# Patient Record
Sex: Female | Born: 1975 | Race: White | Hispanic: No | Marital: Married | Smoking: Never smoker
Health system: Southern US, Community
[De-identification: ages and names within clinical notes are randomized; demographics above are authoritative.]

## PROBLEM LIST (undated history)

## (undated) DIAGNOSIS — Z8719 Personal history of other diseases of the digestive system: Secondary | ICD-10-CM

## (undated) DIAGNOSIS — E282 Polycystic ovarian syndrome: Secondary | ICD-10-CM

## (undated) DIAGNOSIS — I10 Essential (primary) hypertension: Secondary | ICD-10-CM

## (undated) DIAGNOSIS — G43009 Migraine without aura, not intractable, without status migrainosus: Secondary | ICD-10-CM

## (undated) DIAGNOSIS — E559 Vitamin D deficiency, unspecified: Secondary | ICD-10-CM

## (undated) DIAGNOSIS — F411 Generalized anxiety disorder: Secondary | ICD-10-CM

## (undated) DIAGNOSIS — N946 Dysmenorrhea, unspecified: Secondary | ICD-10-CM

## (undated) HISTORY — DX: Polycystic ovarian syndrome: E28.2

## (undated) HISTORY — DX: Personal history of other diseases of the digestive system: Z87.19

## (undated) HISTORY — DX: Dysmenorrhea, unspecified: N94.6

## (undated) HISTORY — DX: Essential (primary) hypertension: I10

## (undated) HISTORY — DX: Migraine without aura, not intractable, without status migrainosus: G43.009

## (undated) HISTORY — DX: Vitamin D deficiency, unspecified: E55.9

## (undated) HISTORY — DX: Generalized anxiety disorder: F41.1

---

## 1997-11-26 HISTORY — PX: CHOLECYSTECTOMY: SHX55

## 2005-11-26 HISTORY — PX: TONSILLECTOMY: SUR1361

## 2010-10-17 ENCOUNTER — Other Ambulatory Visit: Payer: Self-pay

## 2010-11-01 ENCOUNTER — Other Ambulatory Visit: Payer: Self-pay

## 2011-09-19 ENCOUNTER — Other Ambulatory Visit: Payer: Self-pay | Admitting: Internal Medicine

## 2011-10-25 ENCOUNTER — Other Ambulatory Visit: Payer: Self-pay | Admitting: Family Medicine

## 2017-02-19 DIAGNOSIS — J302 Other seasonal allergic rhinitis: Secondary | ICD-10-CM | POA: Insufficient documentation

## 2017-02-19 DIAGNOSIS — F411 Generalized anxiety disorder: Secondary | ICD-10-CM

## 2017-02-19 DIAGNOSIS — I10 Essential (primary) hypertension: Secondary | ICD-10-CM

## 2017-02-19 HISTORY — DX: Essential (primary) hypertension: I10

## 2017-02-19 HISTORY — DX: Generalized anxiety disorder: F41.1

## 2017-07-04 ENCOUNTER — Other Ambulatory Visit: Payer: Self-pay | Admitting: Obstetrics and Gynecology

## 2017-07-04 DIAGNOSIS — Z1231 Encounter for screening mammogram for malignant neoplasm of breast: Secondary | ICD-10-CM

## 2017-08-01 ENCOUNTER — Encounter: Payer: Self-pay | Admitting: Radiology

## 2017-08-01 ENCOUNTER — Ambulatory Visit
Admission: RE | Admit: 2017-08-01 | Discharge: 2017-08-01 | Disposition: A | Discharge status: Home / Self Care | Payer: 59 | Source: Ambulatory Visit | Attending: Obstetrics and Gynecology | Admitting: Obstetrics and Gynecology

## 2017-08-01 DIAGNOSIS — N6489 Other specified disorders of breast: Secondary | ICD-10-CM | POA: Insufficient documentation

## 2017-08-01 DIAGNOSIS — R928 Other abnormal and inconclusive findings on diagnostic imaging of breast: Secondary | ICD-10-CM | POA: Diagnosis not present

## 2017-08-01 DIAGNOSIS — Z1231 Encounter for screening mammogram for malignant neoplasm of breast: Secondary | ICD-10-CM | POA: Insufficient documentation

## 2017-08-08 ENCOUNTER — Inpatient Hospital Stay
Admission: RE | Admit: 2017-08-08 | Discharge: 2017-08-08 | Disposition: A | Discharge status: Home / Self Care | Payer: Self-pay | Source: Ambulatory Visit | Attending: *Deleted | Admitting: *Deleted

## 2017-08-08 ENCOUNTER — Other Ambulatory Visit: Payer: Self-pay | Admitting: *Deleted

## 2017-08-08 DIAGNOSIS — Z9289 Personal history of other medical treatment: Secondary | ICD-10-CM

## 2017-08-09 ENCOUNTER — Other Ambulatory Visit: Payer: Self-pay | Admitting: Obstetrics and Gynecology

## 2017-08-09 DIAGNOSIS — R928 Other abnormal and inconclusive findings on diagnostic imaging of breast: Secondary | ICD-10-CM

## 2017-08-30 ENCOUNTER — Ambulatory Visit: Payer: 59

## 2017-08-30 ENCOUNTER — Other Ambulatory Visit: Payer: 59

## 2017-09-02 ENCOUNTER — Ambulatory Visit
Admission: RE | Admit: 2017-09-02 | Discharge: 2017-09-02 | Disposition: A | Discharge status: Home / Self Care | Payer: 59 | Source: Ambulatory Visit | Attending: Obstetrics and Gynecology | Admitting: Obstetrics and Gynecology

## 2017-09-02 DIAGNOSIS — R928 Other abnormal and inconclusive findings on diagnostic imaging of breast: Secondary | ICD-10-CM | POA: Diagnosis not present

## 2018-02-05 DIAGNOSIS — Z8719 Personal history of other diseases of the digestive system: Secondary | ICD-10-CM | POA: Insufficient documentation

## 2018-02-05 DIAGNOSIS — E282 Polycystic ovarian syndrome: Secondary | ICD-10-CM

## 2018-02-05 DIAGNOSIS — G43009 Migraine without aura, not intractable, without status migrainosus: Secondary | ICD-10-CM | POA: Insufficient documentation

## 2018-02-05 DIAGNOSIS — N946 Dysmenorrhea, unspecified: Secondary | ICD-10-CM

## 2018-02-05 DIAGNOSIS — E559 Vitamin D deficiency, unspecified: Secondary | ICD-10-CM

## 2018-02-05 DIAGNOSIS — Z8711 Personal history of peptic ulcer disease: Secondary | ICD-10-CM | POA: Insufficient documentation

## 2018-02-05 HISTORY — DX: Personal history of peptic ulcer disease: Z87.11

## 2018-02-05 HISTORY — DX: Vitamin D deficiency, unspecified: E55.9

## 2018-02-05 HISTORY — DX: Dysmenorrhea, unspecified: N94.6

## 2018-02-05 HISTORY — DX: Migraine without aura, not intractable, without status migrainosus: G43.009

## 2018-02-05 HISTORY — DX: Polycystic ovarian syndrome: E28.2

## 2018-04-23 ENCOUNTER — Ambulatory Visit
Admission: RE | Admit: 2018-04-23 | Discharge: 2018-04-23 | Disposition: A | Discharge status: Home / Self Care | Payer: 59 | Source: Ambulatory Visit | Attending: Urology | Admitting: Urology

## 2018-04-23 ENCOUNTER — Ambulatory Visit (INDEPENDENT_AMBULATORY_CARE_PROVIDER_SITE_OTHER): Payer: 59 | Admitting: Urology

## 2018-04-23 ENCOUNTER — Encounter: Payer: Self-pay | Admitting: Urology

## 2018-04-23 ENCOUNTER — Encounter

## 2018-04-23 DIAGNOSIS — R3989 Other symptoms and signs involving the genitourinary system: Secondary | ICD-10-CM | POA: Insufficient documentation

## 2018-04-23 LAB — URINALYSIS, COMPLETE
Bilirubin, UA: NEGATIVE
Glucose, UA: NEGATIVE
Ketones, UA: NEGATIVE
LEUKOCYTES UA: NEGATIVE
Nitrite, UA: NEGATIVE
PH UA: 5.5 (ref 5.0–7.5)
PROTEIN UA: NEGATIVE
Specific Gravity, UA: 1.025 (ref 1.005–1.030)
Urobilinogen, Ur: 0.2 mg/dL (ref 0.2–1.0)

## 2018-04-23 LAB — MICROSCOPIC EXAMINATION
RBC MICROSCOPIC, UA: NONE SEEN /HPF (ref 0–2)
WBC, UA: NONE SEEN /hpf (ref 0–5)

## 2018-04-23 LAB — BLADDER SCAN AMB NON-IMAGING

## 2018-04-23 NOTE — Patient Instructions (Signed)
[email or web address]

## 2018-04-23 NOTE — Progress Notes (Signed)
04/23/2018 5:17 PM   Kristine Anderson 01/28/1976 [id number]  Referring provider: Sharee Pimple, CNM [address] [facility] [place], [place] [address]  Chief Complaint  Patient presents with  . Bladder Pain    New Patient    HPI: 42 year old female who presents today for further evaluation of bladder pain.  She was seen and evaluated on 04/01/2018 with acute onset dysuria, urgency, frequency, pelvic pressure and specifically urethral pain by her primary care physician. Her UA at the time was fairly unremarkable other than for 1+ blood, trace ketones, otherwise negative.  Her urine culture grew mixed flora.  She was treated with 3 days of Bactrim.  Her symptoms persistent and she was treated with cipro 7 days.    Her symptoms fail to resolve that she was seen and evaluated in OB/GYN on 04/15/2018 with ongoing urinary symptoms.  Repeat UA at the time was negative other than for nitrittes (claims she was no longer taking pyridium).  There was trace leukocytes without WBCs or RBCs.  Rare bacteria were noted on microscopy.  Urine culture again grew low colony count of mixed flora. She was treated with 3 days of macrobid x 3 days.    UA today is also negative.   She continues to have urinary/ frequency, pelvic pressure specifically on the right side and pain in her urethra particularly on the right side of her urethra specifically.  She denies dysuria.  No gross hematuria.    She does endorse a history of PCOS as well a family history of endometriosis which she feels that she may have but is never officially been diagnosed.  She has a personal history of pelvic pain as well as rectal pain and spasms at times on and off throughout her life.  She is an abnormal menstrual cycles through the various GYN issues throughout her life.  She has 2 children, one vaginal and one delivered by C-section.  She is sexually active and does not have pain with intercourse.  Normal bowel  movements daily which are soft and formed for the most part unless she is under at which time they can be loose.  She is not currently menstruating.  Her last cycle was in the end of April.  Never smoker.  She does have a lot of personal stressors, her husband is currently changing jobs and the changes in insurance.  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  When she feels stressed, she does have tension in her back pelvis and lower legs.  She wonders if some of this is stress related.  PMH: Past Medical History:  Diagnosis Date  . Benign essential hypertension 02/19/2017   Renal duplex done 11/2015: normal Medications: has tried lisinopril, losartan, benicar (olmesartan)  . Dysmenorrhea 02/05/2018  . GAD (generalized anxiety disorder) 02/19/2017  . History of gastric ulcer 02/05/2018  . Migraine without aura and without status migrainosus, not intractable 02/05/2018  . Polycystic ovaries 02/05/2018  . Vitamin D deficiency 02/05/2018    Surgical History: Past Surgical History:  Procedure Laterality Date  . CESAREAN SECTION  2010  . CHOLECYSTECTOMY  1999  . TONSILLECTOMY  2007    Home Medications:  Allergies as of 04/23/2018      Reactions   Banana Hives   Erythromycin Base Nausea Only   Prednisone    Other reaction(s): Other (See Comments) Joint aches   Shellfish Allergy Hives, Itching      Medication List  Accurate as of 04/23/18  5:17 PM. Always use your most recent med list.          hydrochlorothiazide 25 MG tablet Commonly known as:  HYDRODIURIL   lansoprazole 30 MG capsule Commonly known as:  PREVACID Take by mouth.   lisinopril 40 MG tablet Commonly known as:  PRINIVIL,ZESTRIL lisinopril 40 mg tablet   magnesium oxide 400 MG tablet Commonly known as:  MAG-OX Take by mouth.   meclizine 25 MG tablet Commonly known as:  ANTIVERT meclizine 25 mg tablet   metFORMIN 500 MG 24 hr tablet Commonly known as:  GLUCOPHAGE-XR metformin ER  500 mg tablet,extended release 24 hr  TAKE 2 TABLETS BY MOUTH EVERY DAY   olmesartan 40 MG tablet Commonly known as:  BENICAR olmesartan 40 mg tablet   ZYRTEC ALLERGY 10 MG Caps Generic drug:  Cetirizine HCl Zyrtec 10 mg capsule  Take by oral route.       Allergies:  Allergies  Allergen Reactions  . Banana Hives  . Erythromycin Base Nausea Only  . Prednisone     Other reaction(s): Other (See Comments) Joint aches  . Shellfish Allergy Hives and Itching    Family History: Family History  Problem Relation Age of Onset  . Breast cancer Neg Hx   . Prostate cancer Neg Hx   . Kidney cancer Neg Hx     Social History:  reports that she has never smoked. She has never used smokeless tobacco. She reports that she drinks alcohol. She reports that she does not use drugs.  ROS: UROLOGY Frequent Urination?: Yes Hard to postpone urination?: No Burning/pain with urination?: No Get up at night to urinate?: Yes Leakage of urine?: No Urine stream starts and stops?: No Trouble starting stream?: No Do you have to strain to urinate?: No Blood in urine?: No Urinary tract infection?: No Sexually transmitted disease?: No Injury to kidneys or bladder?: No Painful intercourse?: No Weak stream?: Yes Currently pregnant?: No Vaginal bleeding?: No Last menstrual period?: 5/23  Gastrointestinal Nausea?: No Vomiting?: No Indigestion/heartburn?: No Diarrhea?: No Constipation?: No  Constitutional Fever: No Night sweats?: No Weight loss?: No Fatigue?: Yes  Skin Skin rash/lesions?: No Itching?: No  Eyes Blurred vision?: No Double vision?: No  Ears/Nose/Throat Sore throat?: No Sinus problems?: No  Hematologic/Lymphatic Swollen glands?: No Easy bruising?: No  Cardiovascular Leg swelling?: No Chest pain?: No  Respiratory Cough?: No Shortness of breath?: No  Endocrine Excessive thirst?: No  Musculoskeletal Back pain?: No Joint pain?:  No  Neurological Headaches?: No Dizziness?: No  Psychologic Depression?: No Anxiety?: Yes  Physical Exam: BP 126/83   Pulse 91   Ht  (1.702 m)   Wt 246 lb (111.6 kg)   LMP 04/17/2018   BMI 38.53 kg/m   Constitutional:  Alert and oriented, No acute distress. HEENT: [place] AT, moist mucus membranes.  Trachea midline, no masses. Cardiovascular: No clubbing, cyanosis, or edema. Respiratory: Normal respiratory effort, no increased work of breathing. GI: Abdomen is soft, nontender, nondistended, no abdominal masses, obese. Pelvic: Chaperoned by MA Eligha Bridegroom.  Normal external genitalia.  Normal urethral meatus without significant hypomobility or lesions.  Excellent support without evidence of pelvic organ prolapse.  Normal estrogenized vaginal mucosa.  There is tenderness along the right levator muscles within the vaginal vault. Skin: No rashes, bruises or suspicious lesions. Neurologic: Grossly intact, no focal deficits, moving all 4 extremities. Psychiatric: Normal mood and affect.  Laboratory Data: Previous urinalyses from 04/01/2018 and 04/15/2018 reviewed through care everywhere  Creatinine 0.6 on 03/05/2018   Urinalysis Results for orders placed or performed in visit on 04/23/18  Microscopic Examination  Result Value Ref Range   WBC, UA None seen 0 - 5 /hpf   RBC, UA None seen 0 - 2 /hpf   Epithelial Cells (non renal) 0-10 0 - 10 /hpf   Casts Present (A) None seen /lpf   Cast Type Hyaline casts N/A   Mucus, UA Present (A) Not Estab.   Bacteria, UA Few (A) None seen/Few  Urinalysis, Complete  Result Value Ref Range   Specific Gravity, UA 1.025 1.005 - 1.030   pH, UA 5.5 5.0 - 7.5   Color, UA Yellow Yellow   Appearance Ur Clear Clear   Leukocytes, UA Negative Negative   Protein, UA Negative Negative/Trace   Glucose, UA Negative Negative   Ketones, UA Negative Negative   RBC, UA Trace (A) Negative   Bilirubin, UA Negative Negative   Urobilinogen, Ur 0.2 0.2 - 1.0  mg/dL   Nitrite, UA Negative Negative   Microscopic Examination See below:   BLADDER SCAN AMB NON-IMAGING  Result Value Ref Range   Scan Result 0ml      Pertinent Imaging: KUB ordered today, pending  Assessment & Plan:    1. Bladder pain No evidence of infection is contributing factor for bladder pain  We briefly discussed the possibility of interstitial cystitis, although given her fairly abrupt onset of symptoms this seems less likely.  We discussed that this is a diagnosis of exclusion and primary treatment involves symptom control.  She did have tenderness of her levator muscles today particularly on the right side and a mildly suspicious that her symptoms may be secondary to dysfunctional voiding and inability to relax the pelvic floor with associated tenderness of her pelvic floor muscles We discussed that she would likely benefit from pelvic floor physical therapy, offered referral today but declined given her upcoming change in insurance  She was given information today on exercises for pelvic floor relaxation - Urinalysis, Complete - BLADDER SCAN AMB NON-IMAGING - CULTURE, URINE COMPREHENSIVE  2. Urethral pain Plan for KUB today to rule out distal ureteral stone as cause although this seems less likely in the absence of microscopic blood and no previous stone history - DG Abd 1 View; Future   Return for KUB today - will call with results.  Vanna Scotland, MD  [facility] [address], [address] [place], [place] [address] [phone]  I spent 45 min with this patient of which greater than 50% was spent in counseling and coordination of care with the patient.

## 2018-04-25 ENCOUNTER — Telehealth: Payer: Self-pay

## 2018-04-25 ENCOUNTER — Telehealth: Payer: Self-pay | Admitting: Urology

## 2018-04-25 NOTE — Telephone Encounter (Signed)
lmom informing pt to call our office if her sx's do not subside. Informed pt to call with any questions.

## 2018-04-25 NOTE — Telephone Encounter (Signed)
-----   Message from Vanna Scotland, MD sent at 04/24/2018  9:26 AM EDT ----- No obvious stones.  If her pain and frequency failed to subside in the next few weeks, I recommend a renal ultrasound.  Please have her contact us and let us know.  Vanna Scotland, MD

## 2018-04-25 NOTE — Telephone Encounter (Signed)
Patient called the office today to obtain xray results.  Last night, she feels that she passed a stone (or two).  She is experiencing soreness and tenderness on the right side.    Her frequency symptoms have resolved.  She will continue to push fluids and will call the office if needed.

## 2018-04-26 LAB — CULTURE, URINE COMPREHENSIVE

## 2018-04-28 ENCOUNTER — Telehealth: Payer: Self-pay | Admitting: Urology

## 2018-04-28 DIAGNOSIS — R1031 Right lower quadrant pain: Secondary | ICD-10-CM

## 2018-04-28 NOTE — Telephone Encounter (Signed)
Pt has had excruciating pain over the weekend.  She has had lower back pain.  She is still having lots of pain.  Please give her a call. [phone]

## 2018-04-29 ENCOUNTER — Telehealth: Payer: Self-pay | Admitting: Urology

## 2018-04-29 ENCOUNTER — Ambulatory Visit
Admission: RE | Admit: 2018-04-29 | Discharge: 2018-04-29 | Disposition: A | Discharge status: Home / Self Care | Payer: 59 | Source: Ambulatory Visit | Attending: Urology | Admitting: Urology

## 2018-04-29 DIAGNOSIS — R1031 Right lower quadrant pain: Secondary | ICD-10-CM

## 2018-04-29 DIAGNOSIS — N210 Calculus in bladder: Secondary | ICD-10-CM | POA: Insufficient documentation

## 2018-04-29 MED ORDER — HYDROCODONE-ACETAMINOPHEN 5-325 MG PO TABS: 1.0000 | ORAL_TABLET | Freq: Four times a day (QID) | ORAL | 0 refills | Status: AC | PRN

## 2018-04-29 MED ORDER — TAMSULOSIN HCL 0.4 MG PO CAPS: 0.4000 mg | ORAL_CAPSULE | Freq: Every day | ORAL | 0 refills | Status: AC

## 2018-04-29 NOTE — Telephone Encounter (Signed)
-----   Message from Vanna ScotlandAshley Brandon, MD sent at 04/29/2018  2:02 PM EDT ----- CT scan shows a 5 mm stone in the bladder or possibly just at the opening into the bladder on the right side.  Most likely, it is actually in the bladder at this point.  There are no other stones which is also news.  Please prescribe Flomax for 7 days to help just in case the stone has not passed, Vicodin x6 tabs for emergencies, and give her urinary strainer.  Please let us know when the stone passes or follow-up next week if he continued to have discomfort.  Review warning symptoms and patient and indications for more emergent follow-up (ie p.o. intolerance, fevers, uncontrolled pain).  Vanna ScotlandAshley Brandon, MD

## 2018-04-29 NOTE — Telephone Encounter (Signed)
Left pt mess to call, script printed

## 2018-04-29 NOTE — Telephone Encounter (Signed)
Pt states she passed two stones on Thursday night. She states that her urination has become weak and feels uncomfortable. Her back is hurting and causing discomfort. She states that she feels the situation is not resolved and she wants to know what she can do. She does not feel like herself. Please advise.

## 2018-04-29 NOTE — Telephone Encounter (Signed)
Pt informed, per Dr. Apolinar JunesBrandon would like to try and get this done today if pt is able. If not today, she can come in tomorrow anytime before 3pm. Can we make this happen?

## 2018-04-29 NOTE — Telephone Encounter (Signed)
Patient has an app today for her CT scan @ 1:15 she has been notified   Kristine Anderson

## 2018-04-29 NOTE — Telephone Encounter (Signed)
lmom for pt to call office

## 2018-04-29 NOTE — Telephone Encounter (Signed)
CT stone protocol and see what exactly is going on.   This is ordered.  Please let her know.   Hopefully we can get this done today.  Vanna ScotlandAshley Gertrue Willette, MD

## 2018-04-30 NOTE — Telephone Encounter (Signed)
Patient called back and was notified , patient present to office today with stone. Will send off for analysis

## 2018-05-12 ENCOUNTER — Telehealth: Payer: Self-pay | Admitting: Urology

## 2018-05-12 NOTE — Telephone Encounter (Signed)
[facility] asking for Dr. Delana MeyerBrandon's assistant to call with a collection date, @ phone # [phone]. Matt did not leave any further details. Please advise. Thanks.

## 2018-05-14 ENCOUNTER — Other Ambulatory Visit: Payer: Self-pay | Admitting: Urology

## 2018-05-14 NOTE — Telephone Encounter (Signed)
Tried calling back, I could not get anyone could answer the phone.

## 2018-05-15 ENCOUNTER — Ambulatory Visit: Payer: Self-pay | Admitting: Urology

## 2018-05-15 NOTE — Telephone Encounter (Signed)
Spoke to [place] [facility] at [facility] and the specimen date was given.

## 2018-05-22 ENCOUNTER — Telehealth: Payer: Self-pay | Admitting: Urology

## 2018-05-22 NOTE — Telephone Encounter (Signed)
Pt called asking about her stone analysis results. Please advise Akhila @ [phone].

## 2018-05-26 NOTE — Telephone Encounter (Signed)
Results scanned in, please advise. Thanks

## 2018-05-26 NOTE — Telephone Encounter (Signed)
Feel free to share with the patient.  Also, she would benefit from stone book reviewing prevention techniques.   If she would like to discuss this further, he can schedule an appointment with us.   Vanna ScotlandAshley Malissa Slay, MD

## 2018-05-27 NOTE — Telephone Encounter (Signed)
Called pt informed her of information listed on stone analysis, pt gave verbal understanding. Pt requests that analysis be sent to PCP. Will fax report.

## 2019-05-14 ENCOUNTER — Other Ambulatory Visit: Payer: Self-pay | Admitting: Student

## 2019-05-14 DIAGNOSIS — M5412 Radiculopathy, cervical region: Secondary | ICD-10-CM

## 2019-06-02 ENCOUNTER — Ambulatory Visit: Payer: Self-pay

## 2019-06-09 IMAGING — CT CT RENAL STONE PROTOCOL
2 of 4 series · 16 of 46 positions shown, 18 images · non-contrast
Comparison: None.

CLINICAL DATA: Right flank and right lower quadrant pain for 3
weeks.

EXAM:
CT ABDOMEN AND PELVIS WITHOUT CONTRAST
TECHNIQUE: Multidetector CT imaging of the abdomen and pelvis was performed
following the standard protocol without IV contrast.

[Series 4: renal stone · coronal · 0.75mm/px · 3 of 166 slices shown]
[im 56/166  soft-tissue]
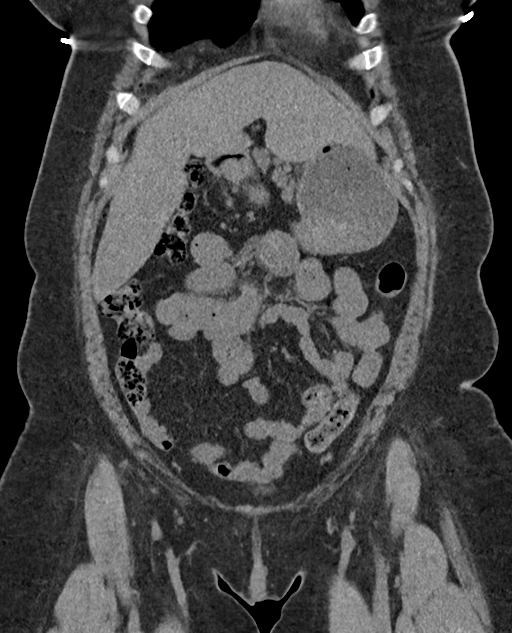
[im 74/166  soft-tissue]
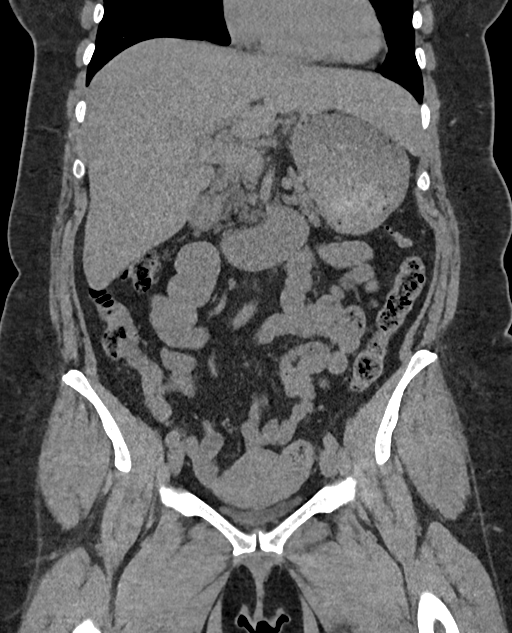
[im 92/166  soft-tissue]
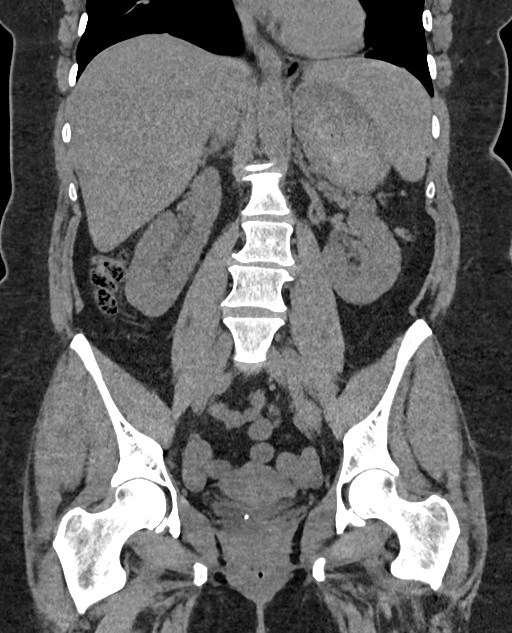

[Series 9: 3mm renal stone · axial · 0.73mm/px · z∈[-1641,-1209]mm · 13 of 157 slices shown, 15 images]
[im 7/157  soft-tissue]
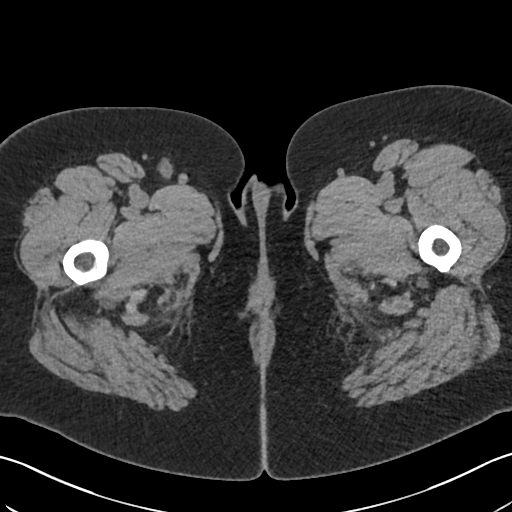
[im 7/157  bone]
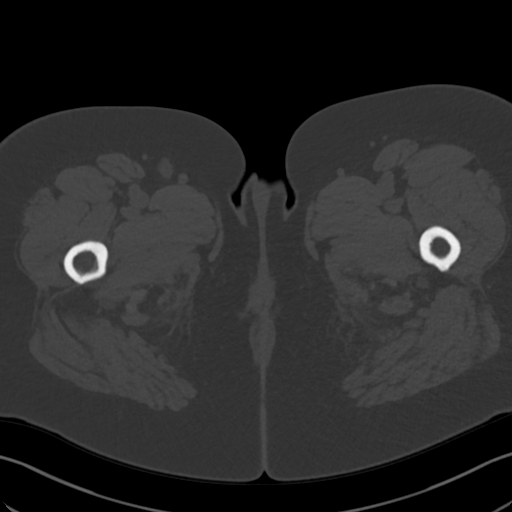
[im 19/157  soft-tissue]
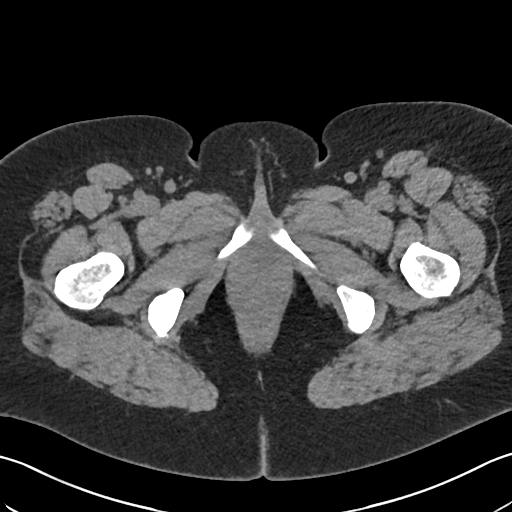
[im 31/157  soft-tissue]
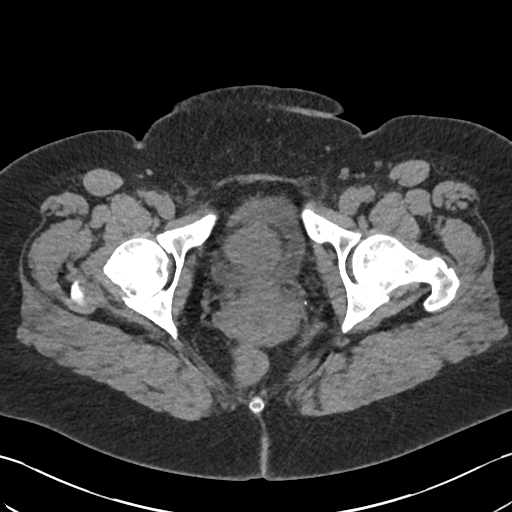
[im 43/157  soft-tissue]
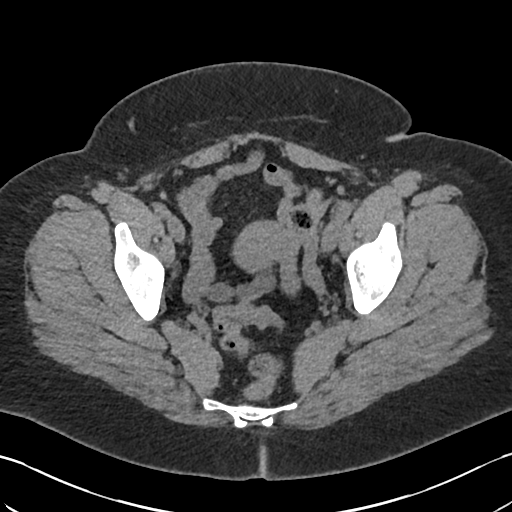
[im 55/157  soft-tissue]
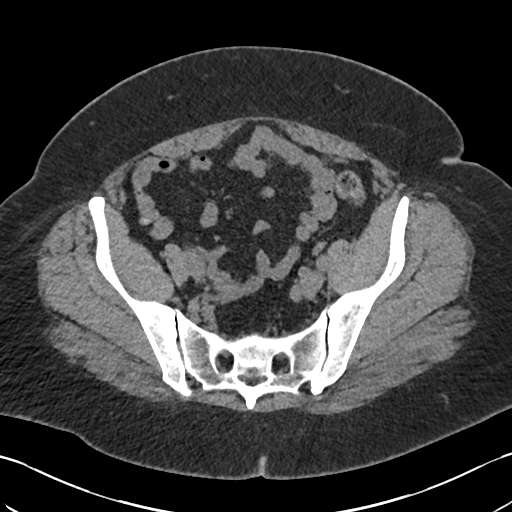
[im 67/157  soft-tissue]
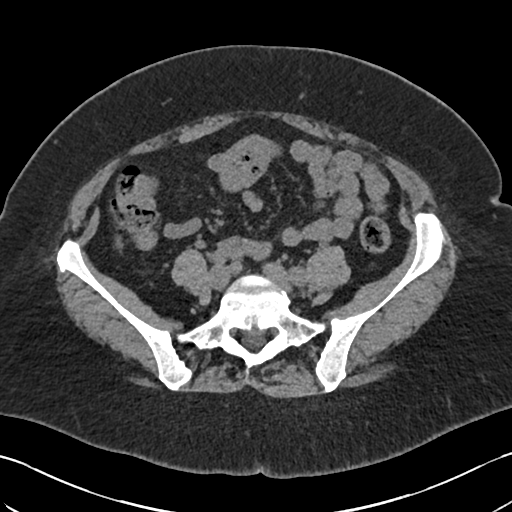
[im 79/157  soft-tissue]
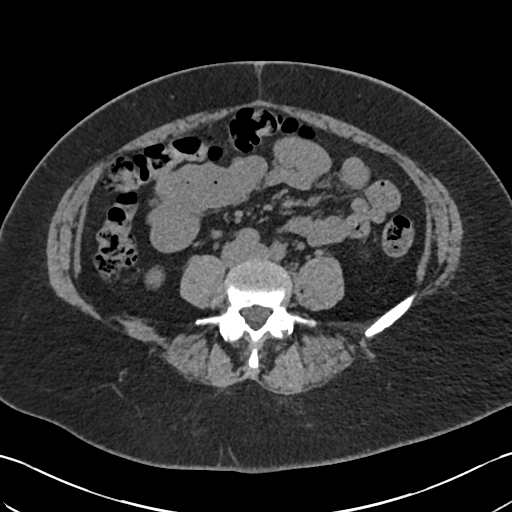
[im 91/157  soft-tissue]
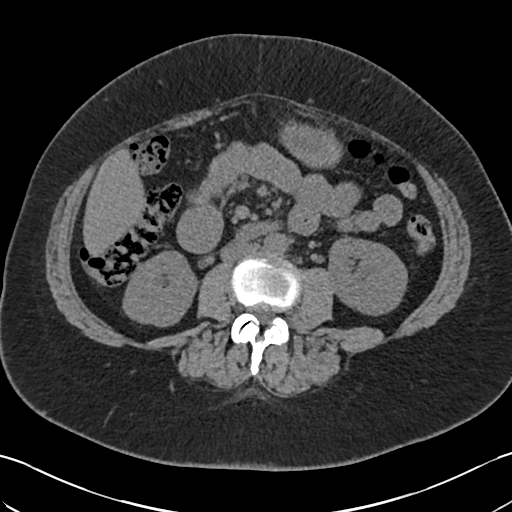
[im 103/157  soft-tissue]
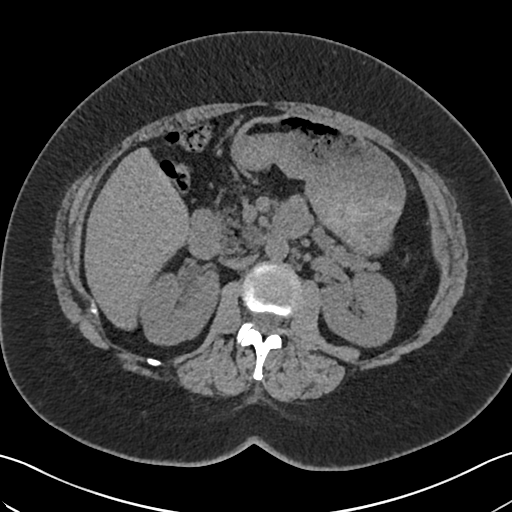
[im 103/157  bone]
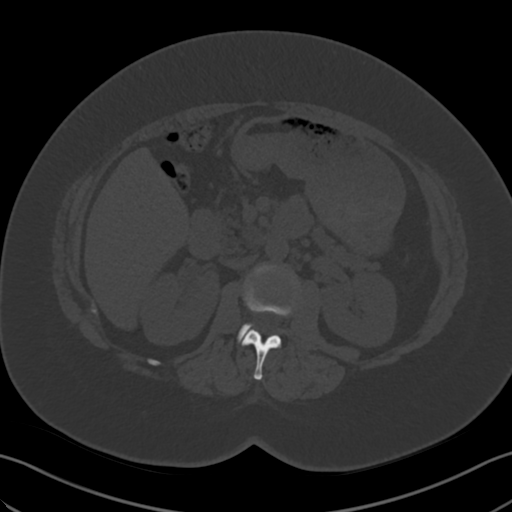
[im 115/157  soft-tissue]
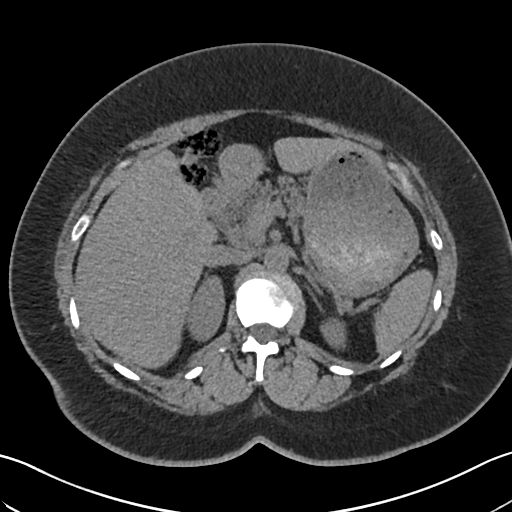
[im 127/157  soft-tissue]
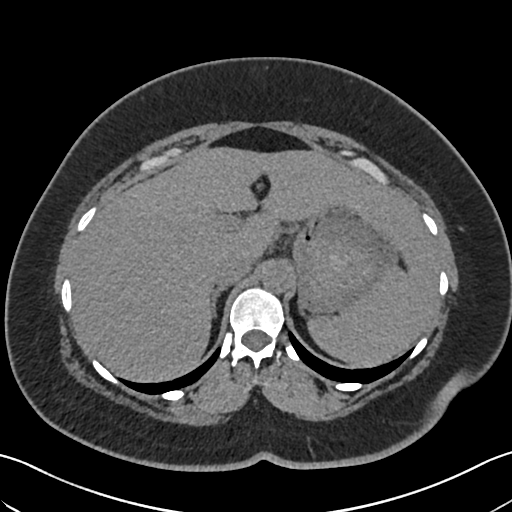
[im 139/157  soft-tissue]
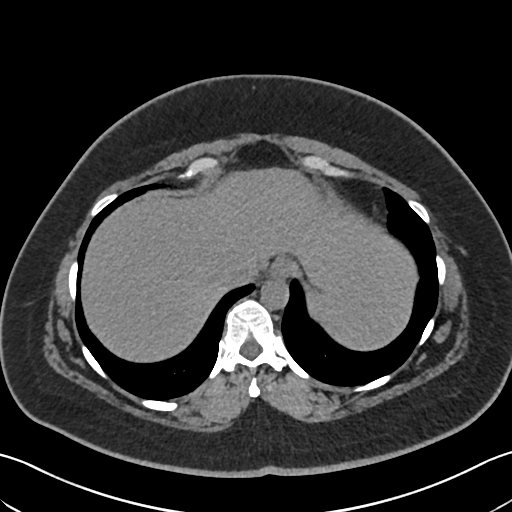
[im 151/157  soft-tissue]
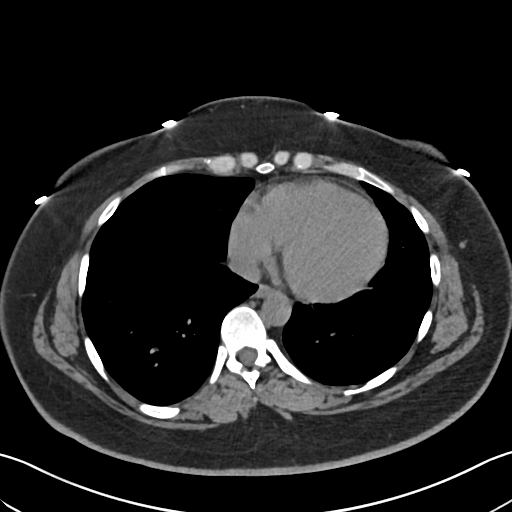

[16 of 46 positions shown; findings below may reference images not displayed]

FINDINGS: Lower chest: Unremarkable.

Hepatobiliary: No focal abnormality in the liver on this study
without intravenous contrast. Gallbladder surgically absent. No
intrahepatic or extrahepatic biliary dilation.

Pancreas: No focal mass lesion. No dilatation of the main duct. No
intraparenchymal cyst. No peripancreatic edema.

Spleen: No splenomegaly. No focal mass lesion.

Adrenals/Urinary Tract: No adrenal nodule or mass. No stones are
evident in either kidney or ureter. There are no secondary changes
in either kidney or ureter. 4 x 5 x 3 mm stone is identified in the
posterior bladder (image 130/series 9). This is near, but probably
outside of the distal right UVJ.

Stomach/Bowel: Stomach is nondistended. No gastric wall thickening.
No evidence of outlet obstruction. Duodenum is normally positioned
as is the ligament of Treitz. No small bowel wall thickening. No
small bowel dilatation. The terminal ileum is normal. The appendix
is normal. No gross colonic mass. No colonic wall thickening. No
substantial diverticular change.

Vascular/Lymphatic: No abdominal aortic aneurysm. No abdominal
aortic atherosclerotic calcification. There is no gastrohepatic or
hepatoduodenal ligament lymphadenopathy. No intraperitoneal or
retroperitoneal lymphadenopathy. No pelvic sidewall lymphadenopathy.

Reproductive: Uterus unremarkable.  There is no adnexal mass.

Other: No intraperitoneal free fluid.

Musculoskeletal: Tiny sclerotic focus in the left femoral head is
likely a bone island. No worrisome lytic or sclerotic osseous
abnormality.
IMPRESSION: 1. 4 x 5 x 3 mm stone in the dependent bladder. This is near the
distal right UVJ, but the stone is felt to be free within the
bladder lumen. There is no secondary change in either kidney or
ureter. Specifically, no hydroureteronephrosis.
2. No evidence for stones in either kidney.
